# Patient Record
Sex: Male | Born: 1990 | Race: White | Hispanic: No | Marital: Single | Smoking: Never smoker
Health system: Southern US, Community
[De-identification: ages and names within clinical notes are randomized; demographics above are authoritative.]

## PROBLEM LIST (undated history)

## (undated) DIAGNOSIS — F419 Anxiety disorder, unspecified: Secondary | ICD-10-CM

## (undated) DIAGNOSIS — F952 Tourette's disorder: Secondary | ICD-10-CM

## (undated) DIAGNOSIS — J45909 Unspecified asthma, uncomplicated: Secondary | ICD-10-CM

## (undated) HISTORY — PX: ADENOIDECTOMY: SHX5191

---

## 2018-07-09 ENCOUNTER — Ambulatory Visit (HOSPITAL_BASED_OUTPATIENT_CLINIC_OR_DEPARTMENT_OTHER)
Admission: RE | Admit: 2018-07-09 | Discharge: 2018-07-09 | Disposition: A | Discharge status: Home / Self Care | Payer: No Typology Code available for payment source | Source: Ambulatory Visit | Attending: Family Medicine | Admitting: Family Medicine

## 2018-07-09 ENCOUNTER — Other Ambulatory Visit (HOSPITAL_BASED_OUTPATIENT_CLINIC_OR_DEPARTMENT_OTHER): Payer: Self-pay | Admitting: Family Medicine

## 2018-07-09 ENCOUNTER — Other Ambulatory Visit: Payer: Self-pay

## 2018-07-09 DIAGNOSIS — N453 Epididymo-orchitis: Secondary | ICD-10-CM | POA: Insufficient documentation

## 2018-07-09 DIAGNOSIS — N50811 Right testicular pain: Secondary | ICD-10-CM

## 2018-09-01 ENCOUNTER — Emergency Department (HOSPITAL_BASED_OUTPATIENT_CLINIC_OR_DEPARTMENT_OTHER)
Admission: EM | Admit: 2018-09-01 | Discharge: 2018-09-01 | Disposition: A | Discharge status: Home / Self Care | Payer: No Typology Code available for payment source | Attending: Emergency Medicine | Admitting: Emergency Medicine

## 2018-09-01 ENCOUNTER — Encounter (HOSPITAL_BASED_OUTPATIENT_CLINIC_OR_DEPARTMENT_OTHER): Payer: Self-pay

## 2018-09-01 ENCOUNTER — Emergency Department (HOSPITAL_BASED_OUTPATIENT_CLINIC_OR_DEPARTMENT_OTHER): Payer: No Typology Code available for payment source

## 2018-09-01 ENCOUNTER — Other Ambulatory Visit: Payer: Self-pay

## 2018-09-01 DIAGNOSIS — R51 Headache: Secondary | ICD-10-CM | POA: Insufficient documentation

## 2018-09-01 DIAGNOSIS — R519 Headache, unspecified: Secondary | ICD-10-CM

## 2018-09-01 HISTORY — DX: Unspecified asthma, uncomplicated: J45.909

## 2018-09-01 HISTORY — DX: Tourette's disorder: F95.2

## 2018-09-01 HISTORY — DX: Anxiety disorder, unspecified: F41.9

## 2018-09-01 MED ORDER — DEXAMETHASONE SODIUM PHOSPHATE 10 MG/ML IJ SOLN
10.0000 mg | Freq: Once | INTRAMUSCULAR | Status: AC
  Administered 2018-09-01: 10 mg via INTRAVENOUS
  Filled 2018-09-01: qty 1

## 2018-09-01 MED ORDER — KETOROLAC TROMETHAMINE 30 MG/ML IJ SOLN
30.0000 mg | Freq: Once | INTRAMUSCULAR | Status: AC
  Administered 2018-09-01: 30 mg via INTRAVENOUS
  Filled 2018-09-01: qty 1

## 2018-09-01 MED ORDER — DIPHENHYDRAMINE HCL 50 MG/ML IJ SOLN
25.0000 mg | Freq: Once | INTRAMUSCULAR | Status: AC
  Administered 2018-09-01: 13:00:00 25 mg via INTRAVENOUS
  Filled 2018-09-01: qty 1

## 2018-09-01 MED ORDER — METOCLOPRAMIDE HCL 5 MG/ML IJ SOLN
10.0000 mg | Freq: Once | INTRAMUSCULAR | Status: AC
  Administered 2018-09-01: 10 mg via INTRAVENOUS
  Filled 2018-09-01: qty 2

## 2018-09-01 MED ORDER — SODIUM CHLORIDE 0.9 % IV BOLUS
1000.0000 mL | Freq: Once | INTRAVENOUS | Status: AC
  Administered 2018-09-01: 13:00:00 1000 mL via INTRAVENOUS

## 2018-09-01 NOTE — ED Triage Notes (Signed)
Pt c/o HA behind eyes, blurred vision x 3 days-today he had pain to "left side but mostly left arm" with movement-pt NAD-steady gait

## 2018-09-01 NOTE — Discharge Instructions (Signed)
Tylenol 1000 mg every 6 hours as needed for pain.  You may also take the ibuprofen prescribed by your primary doctor as needed as well.  Follow-up with primary doctor if symptoms or not improving in the next few days.

## 2018-09-01 NOTE — ED Provider Notes (Signed)
[facility] EMERGENCY DEPARTMENT Provider Note   CSN: [id number] Arrival date & time: 09/01/18  1206     History   Chief Complaint Chief Complaint  Patient presents with  . Headache    HPI Sylvan CheeseCharles Aaron Parton is a 28 y.o. male.     Patient is a 28 year old male with past medical history of anxiety and asthma presenting with complaints of headache and left arm numbness.  Patient states that he started with a severe headache yesterday that was associated with blurry vision.  He states he had a hard time reading the numbers on his cell phone.  Today he is now experiencing weakness and discomfort in his left arm.  He denies any involvement of his leg.  He denies any fevers or chills.  Patient currently taking doxycycline for a scrotal infection as well as 800 mg ibuprofen.  The ibuprofen has not helped his headache.  The history is provided by the patient.  Headache Pain location:  Generalized Quality:  Dull Radiates to:  Does not radiate Onset quality:  Sudden Duration:  2 days Timing:  Constant Progression:  Worsening Chronicity:  New Relieved by:  Nothing Worsened by:  Nothing Ineffective treatments:  None tried   Past Medical History:  Diagnosis Date  . Anxiety   . Asthma   . Tourette syndrome     There are no active problems to display for this patient.   Past Surgical History:  Procedure Laterality Date  . ADENOIDECTOMY          Home Medications    Prior to Admission medications   Not on File    Family History No family history on file.  Social History Social History   Tobacco Use  . Smoking status: Never Smoker  . Smokeless tobacco: Never Used  Substance Use Topics  . Alcohol use: Never    Frequency: Never  . Drug use: Never     Allergies   Tegretol [carbamazepine]   Review of Systems Review of Systems  Neurological: Positive for headaches.  All other systems reviewed and are negative.    Physical Exam Updated  Vital Signs BP (!) 136/92 (BP Location: Right Arm)   Pulse 86   Temp 98.6 F (37 C) (Oral)   Resp 18   Ht 5' 7.5" (1.715 m)   Wt 58.7 kg   SpO2 98%   BMI 19.98 kg/m   Physical Exam Vitals signs and nursing note reviewed.  Constitutional:      General: He is not in acute distress.    Appearance: He is well-developed. He is not diaphoretic.  HENT:     Head: Normocephalic and atraumatic.  Eyes:     Extraocular Movements: Extraocular movements intact.     Pupils: Pupils are equal, round, and reactive to light.  Neck:     Musculoskeletal: Normal range of motion and neck supple.  Cardiovascular:     Rate and Rhythm: Normal rate and regular rhythm.     Heart sounds: No murmur. No friction rub.  Pulmonary:     Effort: Pulmonary effort is normal. No respiratory distress.     Breath sounds: Normal breath sounds. No wheezing or rales.  Abdominal:     General: Bowel sounds are normal. There is no distension.     Palpations: Abdomen is soft.     Tenderness: There is no abdominal tenderness.  Musculoskeletal: Normal range of motion.  Skin:    General: Skin is warm and dry.  Neurological:  Mental Status: He is alert and oriented to person, place, and time.     Cranial Nerves: No cranial nerve deficit, dysarthria or facial asymmetry.     Sensory: No sensory deficit.     Motor: No weakness.     Coordination: Coordination normal.      ED Treatments / Results  Labs (all labs ordered are listed, but only abnormal results are displayed) Labs Reviewed - No data to display  EKG None  Radiology No results found.  Procedures Procedures (including critical care time)  Medications Ordered in ED Medications  sodium chloride 0.9 % bolus 1,000 mL (has no administration in time range)  ketorolac (TORADOL) 30 MG/ML injection 30 mg (has no administration in time range)  diphenhydrAMINE (BENADRYL) injection 25 mg (has no administration in time range)  dexamethasone (DECADRON)  injection 10 mg (has no administration in time range)  metoCLOPramide (REGLAN) injection 10 mg (has no administration in time range)     Initial Impression / Assessment and Plan / ED Course  I have reviewed the triage vital signs and the nursing notes.  Pertinent labs & imaging results that were available during my care of the patient were reviewed by me and considered in my medical decision making (see chart for details).  Patient is a 28 year old male presenting with complaints of headache and left arm numbness.  Patient's neurologic exam is nonfocal and CT scan of his head is unremarkable.  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  Patient given a migraine cocktail with minimal relief.  At this point, I feel as though patient is stable for discharge.  He is to follow-up as needed if not improving.  Final Clinical Impressions(s) / ED Diagnoses   Final diagnoses:  None    ED Discharge Orders    None       Veryl Speak, MD 09/01/18 1445

## 2021-05-06 IMAGING — CT CT HEAD WITHOUT CONTRAST
3 series · 15 of 47 positions shown, 18 images · non-contrast
Comparison: None.

CLINICAL DATA: Focal neuro deficit, > 6 hrs, stroke suspected

EXAM:
CT HEAD WITHOUT CONTRAST
TECHNIQUE: Contiguous axial images were obtained from the base of the skull
through the vertex without intravenous contrast.

[Series 2: head wo · axial · 0.41mm/px · z∈[-146,-21]mm · 9 of 31 slices shown, 12 images]
[im 3/31  brain]
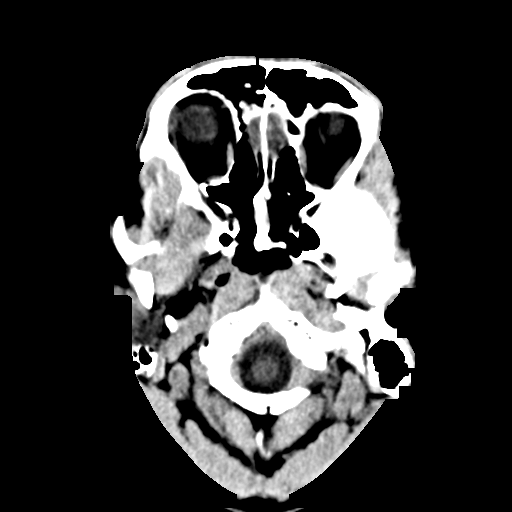
[im 3/31  bone]
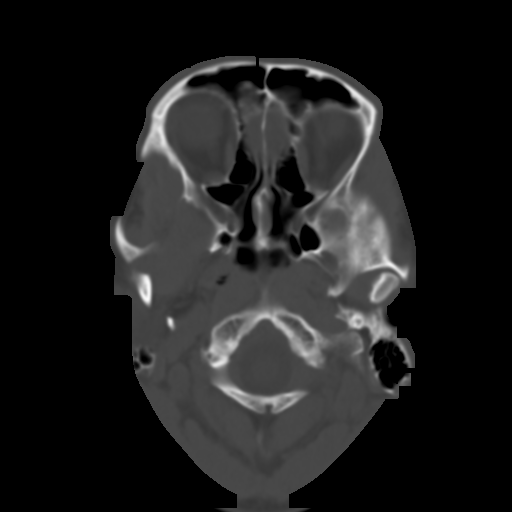
[im 6/31  brain]
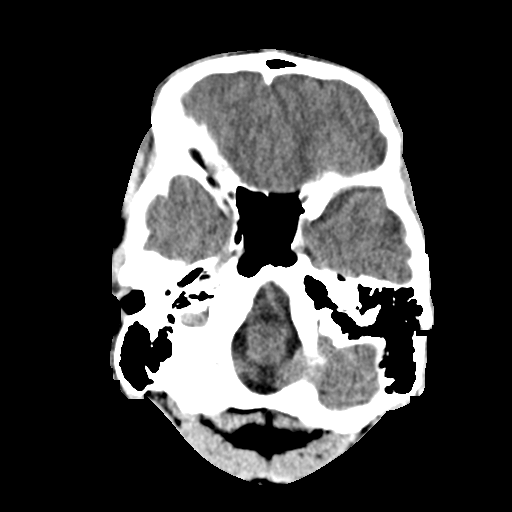
[im 9/31  brain]
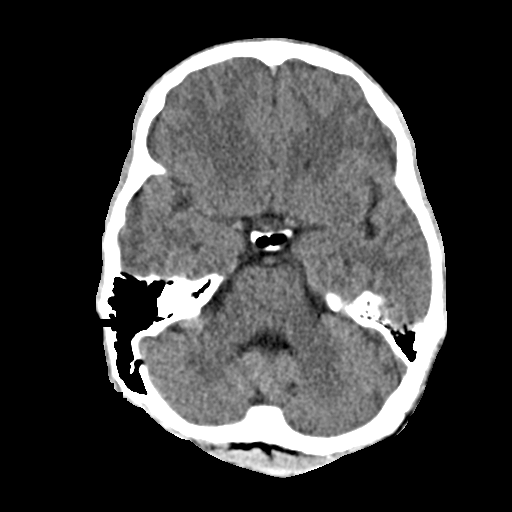
[im 12/31  brain]
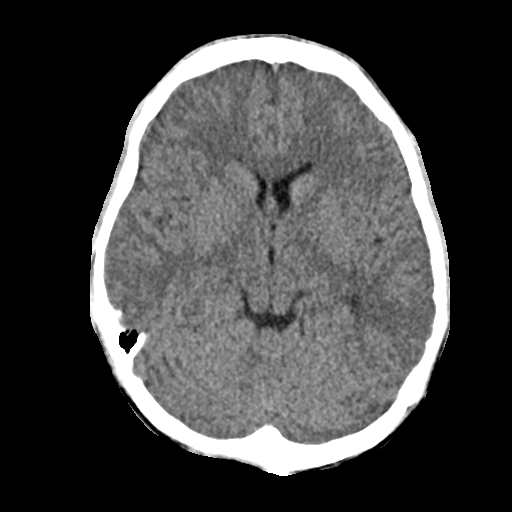
[im 16/31  brain]
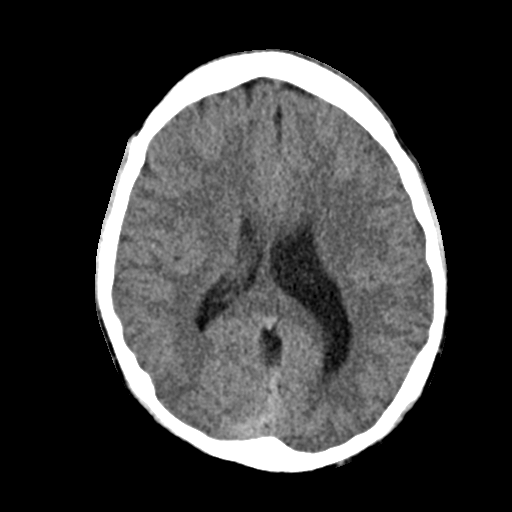
[im 16/31  bone]
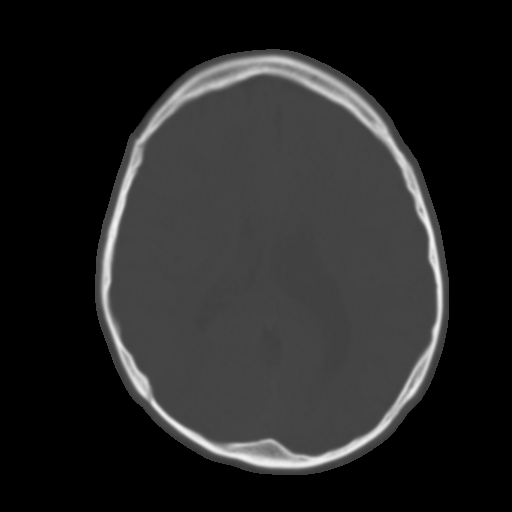
[im 19/31  brain]
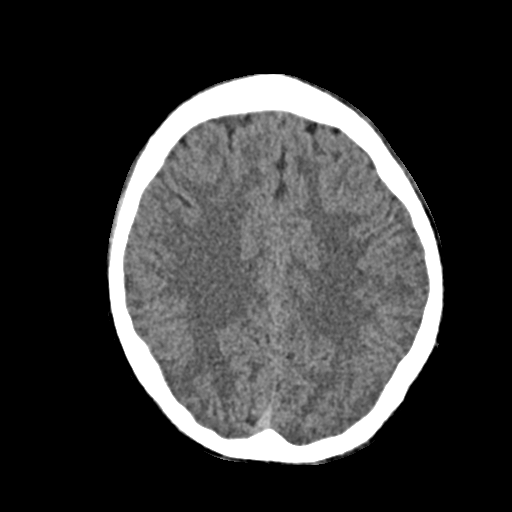
[im 22/31  brain]
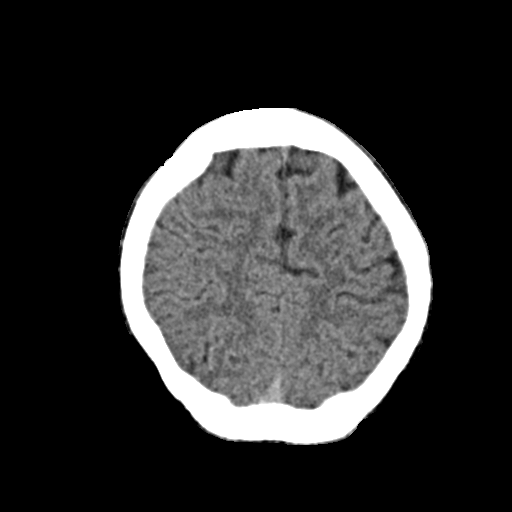
[im 25/31  brain]
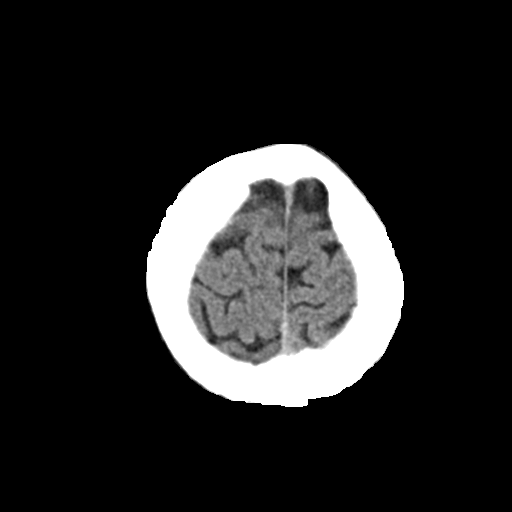
[im 28/31  brain]
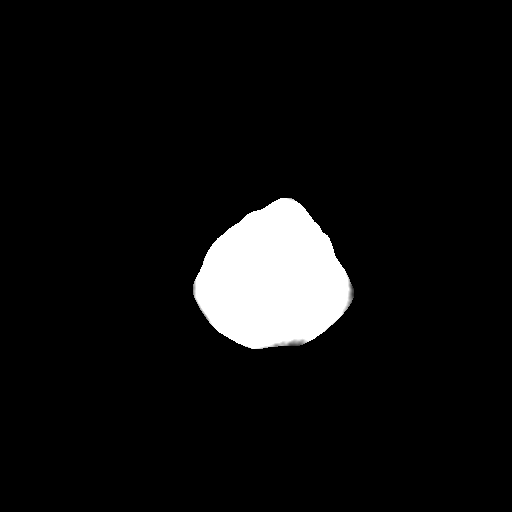
[im 28/31  bone]
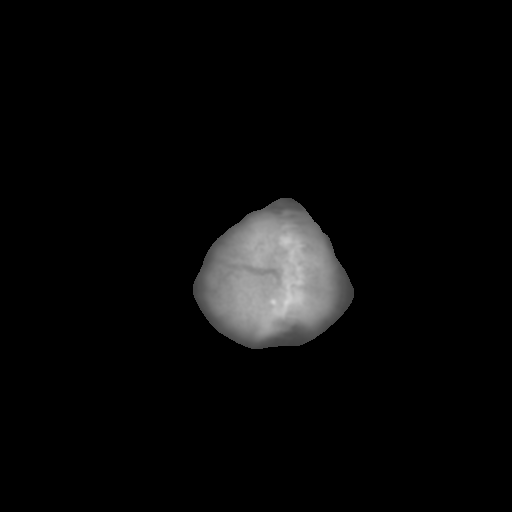

[Series 4: coronal soft · coronal · 0.31mm/px · 3 of 66 slices shown]
[im 22/66  brain]
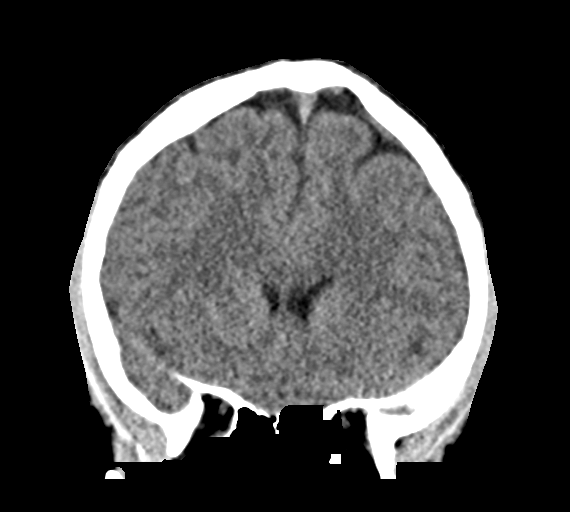
[im 29/66  brain]
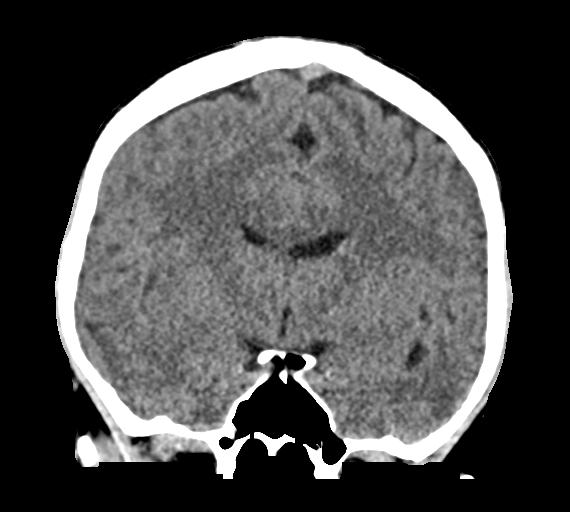
[im 37/66  brain]
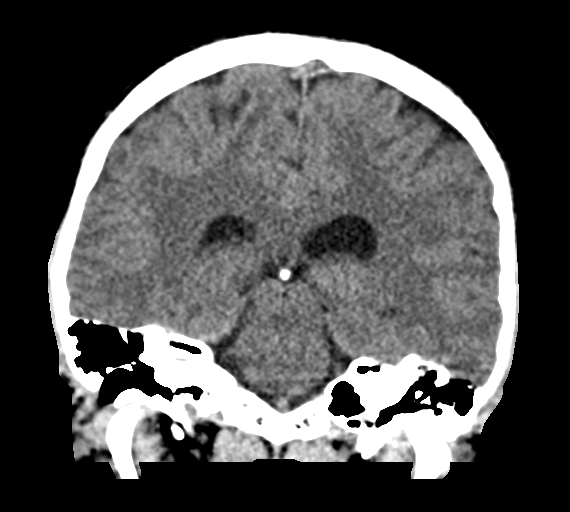

[Series 5: sag soft · sagittal · 0.30mm/px · 3 of 54 slices shown]
[im 18/54  brain]
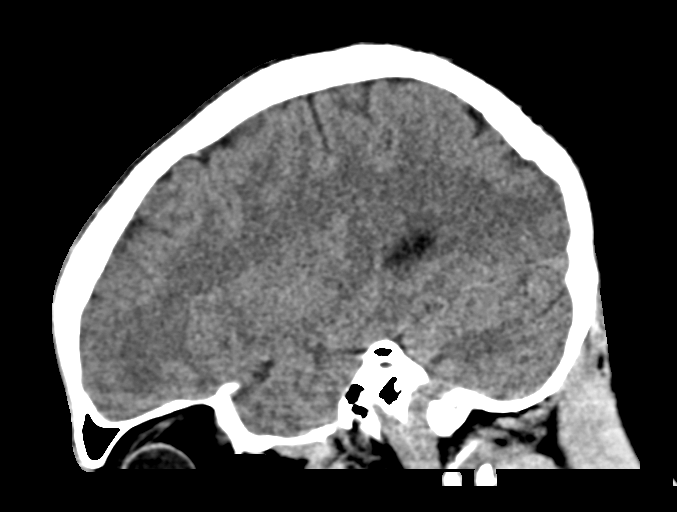
[im 27/54  brain]
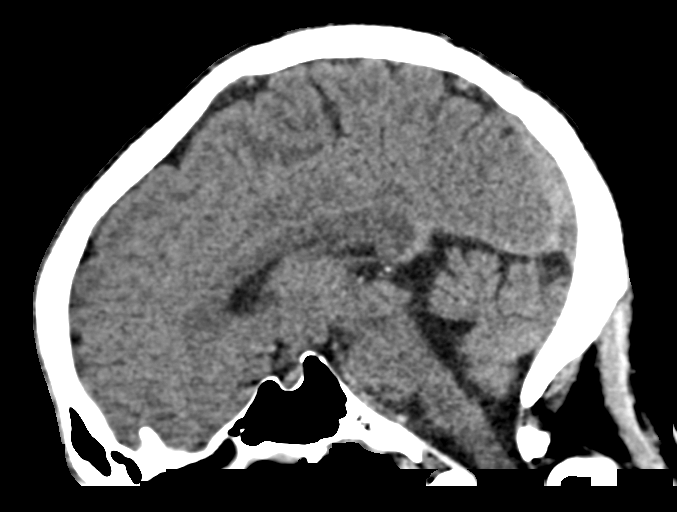
[im 36/54  brain]
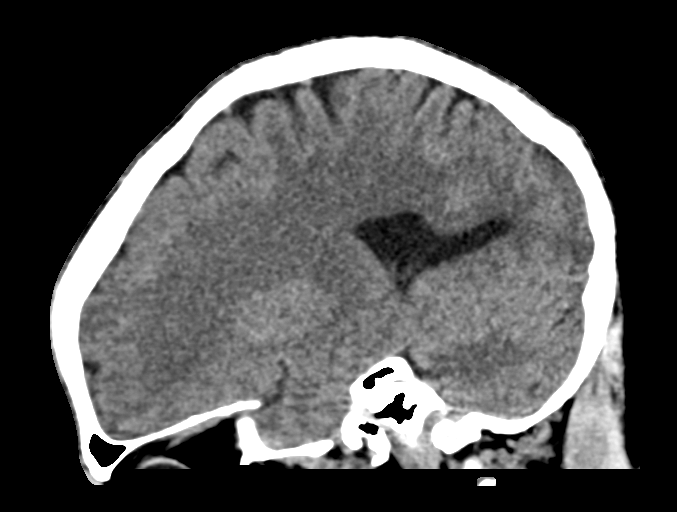

[15 of 47 positions shown; findings below may reference images not displayed]

FINDINGS: Brain: No evidence of acute infarction, hemorrhage, hydrocephalus,
extra-axial collection or mass lesion/mass effect.

Vascular: No hyperdense vessel or unexpected calcification.

Skull: No fracture or focal lesion.

Sinuses/Orbits: Paranasal sinuses and mastoid air cells are clear.
The visualized orbits are unremarkable.

Other: None.
IMPRESSION: Unremarkable noncontrast head CT.
